# Patient Record
Sex: Female | Born: 2005 | Race: White | Hispanic: No | Marital: Single | Smoking: Never smoker
Health system: Southern US, Community
[De-identification: ages and names within clinical notes are randomized; demographics above are authoritative.]

## PROBLEM LIST (undated history)

## (undated) HISTORY — PX: NO PAST SURGERIES: SHX2092

---
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
## 2023-12-20 ENCOUNTER — Ambulatory Visit (INDEPENDENT_AMBULATORY_CARE_PROVIDER_SITE_OTHER): Payer: Self-pay | Admitting: Family Medicine

## 2023-12-20 ENCOUNTER — Encounter: Payer: Self-pay | Admitting: Family Medicine

## 2023-12-20 VITALS — BP 122/86 | HR 112 | Temp 99.2°F | Ht 63.25 in | Wt 112.0 lb

## 2023-12-20 DIAGNOSIS — H66012 Acute suppurative otitis media with spontaneous rupture of ear drum, left ear: Secondary | ICD-10-CM

## 2023-12-20 DIAGNOSIS — R6889 Other general symptoms and signs: Secondary | ICD-10-CM | POA: Diagnosis not present

## 2023-12-20 LAB — POC COVID19/FLU A&B COMBO
Covid Antigen, POC: NEGATIVE
Influenza A Antigen, POC: NEGATIVE
Influenza B Antigen, POC: NEGATIVE

## 2023-12-20 MED ORDER — FLUTICASONE PROPIONATE 50 MCG/ACT NA SUSP: 2.0000 | Freq: Every day | NASAL | 0 refills | Status: DC

## 2023-12-20 MED ORDER — AMOXICILLIN-POT CLAVULANATE 875-125 MG PO TABS: 1.0000 | ORAL_TABLET | Freq: Two times a day (BID) | ORAL | 0 refills | Status: DC

## 2023-12-20 NOTE — Patient Instructions (Addendum)
Thank you for coming to the office today.  Likely originally was a virus, and has led to a backup of fluid and infection behind the Left ear. R ear looks good.  Lungs are clear.  Start the antibiotic Augmentin twice a day for 10 days  Start nasal steroid Flonase 2 sprays in each nostril daily for 4-6 weeks, may repeat course seasonally or as needed  For pain and swelling / pressure  Alternate Tylenol and Ibuprofen  Tylenol dosing Extr Str 500mg  x 2 = 1000mg  up to 3 times per day max  Ibuprofen dosing is 200mg  x 3 = 600mg  max up to 3 times per day for pain swelling pressure.  May consider Sudafed OTC for sinus pressure as well.  If severe symptoms, not improving, or ear pain pressure, we can consider a Rx Strength steroid Prednisone.  If still not improving hearing issues we can refer to ENT    Otitis Media, Adult  Otitis media occurs when there is inflammation and fluid in the middle ear with signs and symptoms of an acute infection. The middle ear is a part of the ear that contains bones for hearing as well as air that helps send sounds to the brain. When infected fluid builds up in this space, it causes pressure and can lead to an ear infection. The eustachian tube connects the middle ear to the back of the nose (nasopharynx) and normally allows air into the middle ear. If the eustachian tube becomes blocked, fluid can build up and become infected. What are the causes? This condition is caused by a blockage in the eustachian tube. This can be caused by mucus or by swelling of the tube. Problems that can cause a blockage include: A cold or other upper respiratory infection. Allergies. An irritant, such as tobacco smoke. Enlarged adenoids. The adenoids are areas of soft tissue located high in the back of the throat, behind the nose and the roof of the mouth. They are part of the body's defense system (immune system). A mass in the nasopharynx. Damage to the ear caused by pressure  changes (barotrauma). What increases the risk? You are more likely to develop this condition if you: Smoke or are exposed to tobacco smoke. Have an opening in the roof of your mouth (cleft palate). Have gastroesophageal reflux. Have an immune system disorder. What are the signs or symptoms? Symptoms of this condition include: Ear pain. Fever. Decreased hearing. Tiredness (lethargy). Fluid leaking from the ear, if the eardrum is ruptured or has burst. Ringing in the ear. How is this diagnosed?  This condition is diagnosed with a physical exam. During the exam, your health care provider will use an instrument called an otoscope to look in your ear and check for redness, swelling, and fluid. He or she will also ask about your symptoms. Your health care provider may also order tests, such as: A pneumatic otoscopy. This is a test to check the movement of the eardrum. It is done by squeezing a small amount of air into the ear. A tympanogram. This is a test that shows how well the eardrum moves in response to air pressure in the ear canal. It provides a graph for your health care provider to review. How is this treated? This condition can go away on its own within 3-5 days. But if the condition is caused by a bacterial infection and does not go away on its own, or if it keeps coming back, your health care provider may: Prescribe  antibiotic medicine to treat the infection. Prescribe or recommend medicines to control pain. Follow these instructions at home: Take over-the-counter and prescription medicines only as told by your health care provider. If you were prescribed an antibiotic medicine, take it as told by your health care provider. Do not stop taking the antibiotic even if you start to feel better. Keep all follow-up visits. This is important. Contact a health care provider if: You have bleeding from your nose. There is a lump on your neck. You are not feeling better in 5 days. You  feel worse instead of better. Get help right away if: You have severe pain that is not controlled with medicine. You have swelling, redness, or pain around your ear. You have stiffness in your neck. A part of your face is not moving (paralyzed). The bone behind your ear (mastoid bone) is tender when you touch it. You develop a severe headache. Summary Otitis media is redness, soreness, and swelling of the middle ear, usually resulting in pain and decreased hearing. This condition can go away on its own within 3-5 days. If the problem does not go away in 3-5 days, your health care provider may give you medicines to treat the infection. If you were prescribed an antibiotic medicine, take it as told by your health care provider. Follow all instructions that were given to you by your health care provider. This information is not intended to replace advice given to you by your health care provider. Make sure you discuss any questions you have with your health care provider. Document Revised: 02/15/2021 Document Reviewed: 02/15/2021 Elsevier Patient Education  2024 Elsevier Inc.    Please schedule a Follow-up Appointment to: No follow-ups on file.  If you have any other questions or concerns, please feel free to call the office or send a message through MyChart. You may also schedule an earlier appointment if necessary.  Additionally, you may be receiving a survey about your experience at our office within a few days to 1 week by e-mail or mail. We value your feedback.  Saralyn Pilar, DO [facility], [place]

## 2023-12-20 NOTE — Progress Notes (Signed)
Subjective:    Patient ID: Sonya Sherman, female    DOB: 11-07-2006, 18 y.o.   MRN: [id number]  Sonya Sherman is a 18 y.o. female presenting on 12/20/2023 for Ear Pain and Sinusitis  PCP Nicki Reaper, FNP   Patient presents for a same day appointment.   HPI  Discussed the use of AI scribe software for clinical note transcription with the patient, who gave verbal consent to proceed.  History of Present Illness    The patient presents with ear pain and congestion.  She began experiencing symptoms around Saturday or Sunday, initially noticing her voice starting to go out, which led her to suspect she might be getting sick. By Monday, she developed chest congestion, a cough, and a stuffy nose. On Tuesday, her symptoms progressed to include a runny nose, reduced chest congestion, but significant headache and sinus pain.  This morning, she woke up with severe pain in her left ear, for which she took Tylenol. Despite the medication, she continues to experience throbbing and ringing in the ear, along with popping sensations. No hearing loss. No fluid discharge from the ear and she has not used any nasal sprays or other medications for her symptoms.  She mentions a single episode of digestive symptoms on Tuesday, where she felt unable to eat and vomited after consuming chicken noodle soup. However, she was able to eat dinner later that night without issue and has not experienced further digestive symptoms since.  No known history of ear infections but recalls having a sinus infection as a child. No recent exposure to sick individuals and no recent respiratory conditions since infancy, when she had asthma.             07/14/2022   10:25 AM 06/24/2020    2:42 PM  Depression screen PHQ 2/9  Decreased Interest 0 0  Down, Depressed, Hopeless 1 0  PHQ - 2 Score 1 0  Altered sleeping 0 2  Tired, decreased energy 0 1  Change in appetite 1 2  Feeling bad or failure about yourself  1  0  Trouble concentrating 0 1  Moving slowly or fidgety/restless 0 1  Suicidal thoughts 0 0  PHQ-9 Score 3 7  Difficult doing work/chores Somewhat difficult        07/14/2022   10:25 AM 06/24/2020    2:42 PM  GAD 7 : Generalized Anxiety Score  Nervous, Anxious, on Edge 2 1  Control/stop worrying 1 1  Worry too much - different things 1 0  Trouble relaxing 1 0  Restless 2 0  Easily annoyed or irritable 0 1  Afraid - awful might happen 0 2  Total GAD 7 Score 7 5    Social History   Tobacco Use   Smoking status: Never   Smokeless tobacco: Never  Vaping Use   Vaping status: Never Used  Substance Use Topics   Alcohol use: Never   Drug use: Never    Review of Systems Per HPI unless specifically indicated above     Objective:    BP 122/86   Pulse (!) 112   Temp 99.2 F (37.3 C)   Ht 5' 3.25" (1.607 m)   Wt 112 lb (50.8 kg)   SpO2 97%   BMI 19.68 kg/m   Wt Readings from Last 3 Encounters:  12/20/23 112 lb (50.8 kg) (19%, Z= -0.88)*  07/14/22 112 lb (50.8 kg) (24%, Z= -0.71)*  06/24/20 112 lb (50.8 kg) (35%, Z= -0.38)*   *  Growth percentiles are based on CDC (Girls, 2-20 Years) data.    Physical Exam Vitals and nursing note reviewed.  Constitutional:      General: She is not in acute distress.    Appearance: She is well-developed. She is not diaphoretic.     Comments: Well-appearing, comfortable, cooperative  HENT:     Head: Normocephalic and atraumatic.     Right Ear: Tympanic membrane, ear canal and external ear normal. There is no impacted cerumen.     Left Ear: Ear canal and external ear normal. There is no impacted cerumen.     Ears:     Comments: Left TM abnormal anatomy with erythema bulging evidence of TM rupture and some effusion vs purulence. Eyes:     General:        Right eye: No discharge.        Left eye: No discharge.     Conjunctiva/sclera: Conjunctivae normal.  Neck:     Thyroid: No thyromegaly.  Cardiovascular:     Rate and Rhythm:  Normal rate and regular rhythm.     Heart sounds: Normal heart sounds. No murmur heard. Pulmonary:     Effort: Pulmonary effort is normal. No respiratory distress.     Breath sounds: Normal breath sounds. No wheezing or rales.  Musculoskeletal:        General: Normal range of motion.     Cervical back: Normal range of motion and neck supple.  Lymphadenopathy:     Cervical: No cervical adenopathy.  Skin:    General: Skin is warm and dry.     Findings: No erythema or rash.  Neurological:     Mental Status: She is alert and oriented to person, place, and time.  Psychiatric:        Behavior: Behavior normal.     Comments: Well groomed, good eye contact, normal speech and thoughts     Results for orders placed or performed in visit on 12/20/23  POC Covid19/Flu A&B Antigen   Collection Time: 12/20/23  1:51 PM  Result Value Ref Range   Influenza A Antigen, POC Negative Negative   Influenza B Antigen, POC Negative Negative   Covid Antigen, POC Negative Negative      Assessment & Plan:   Problem List Items Addressed This Visit   None Visit Diagnoses       Non-recurrent acute suppurative otitis media of left ear with spontaneous rupture of tympanic membrane    -  Primary   Relevant Medications   amoxicillin-clavulanate (AUGMENTIN) 875-125 MG tablet   fluticasone (FLONASE) 50 MCG/ACT nasal spray     Flu-like symptoms       Relevant Orders   POC Covid19/Flu A&B Antigen (Completed)         Otitis Media, left Acute, with TM rupture spontaneous Left ear pain, throbbing, and ringing with a history of recent upper respiratory symptoms. Eardrum appears red and swollen on examination with concern of abnormal anatomy with mildrupture. No history of ear infections. No discharge noted.  -Prescribe Augmentin orally twice a day for 10 days. -Advise against ear drops or submerging ear in water. Start nasal steroid Flonase 2 sprays in each nostril daily for 4-6 weeks, may repeat course  seasonally or as needed   Upper Respiratory Infection Recent onset of chest congestion, cough, and nasal symptoms. No respiratory distress noted on examination. COVID and flu tests negative. Proceed w/ antibiotics as above Flonase -Consider over-the-counter Sudafed for sinus pressure.  Pain Management Headache and ear  pain reported. Currently taking Tylenol. -Advise alternating Tylenol and ibuprofen for pain and swelling relief. Dosing advised   Follow-up If severe symptoms persist or hearing issues develop, consider referral to ENT. Check-in after completion of antibiotic course.        Orders Placed This Encounter  Procedures   POC Covid19/Flu A&B Antigen    Meds ordered this encounter  Medications   amoxicillin-clavulanate (AUGMENTIN) 875-125 MG tablet    Sig: Take 1 tablet by mouth 2 (two) times daily.    Dispense:  20 tablet    Refill:  0   fluticasone (FLONASE) 50 MCG/ACT nasal spray    Sig: Place 2 sprays into both nostrils daily. Use for 4-6 weeks then stop and use seasonally or as needed.    Dispense:  16 g    Refill:  0    Follow up plan: Return if symptoms worsen or fail to improve.   Saralyn Pilar, DO [facility] 12/20/2023, 1:48 PM

## 2024-01-16 ENCOUNTER — Other Ambulatory Visit: Payer: Self-pay | Admitting: Family Medicine

## 2024-01-16 DIAGNOSIS — H66012 Acute suppurative otitis media with spontaneous rupture of ear drum, left ear: Secondary | ICD-10-CM

## 2024-01-17 NOTE — Telephone Encounter (Signed)
 Courtesy refill. Requested Prescriptions  Pending Prescriptions Disp Refills   fluticasone (FLONASE) 50 MCG/ACT nasal spray [Pharmacy Med Name: FLUTICASONE PROP 50 MCG SPRAY] 16 mL 0    Sig: PLACE 2 SPRAYS INTO BOTH NOSTRILS DAILY. USE FOR 4-6 WEEKS THEN STOP AND USE SEASONALLY OR AS NEEDED.     Ear, Nose, and Throat: Nasal Preparations - Corticosteroids Failed - 01/17/2024  7:44 AM      Failed - Valid encounter within last 12 months    Recent Outpatient Visits           4 weeks ago Non-recurrent acute suppurative otitis media of left ear with spontaneous rupture of tympanic membrane   [facility] Smitty Cords, DO   1 year ago Need for influenza vaccination   [facility] [place], Salvadore Oxford, NP   3 years ago Encounter to establish care   [facility], Kandee Keen, DO

## 2024-06-19 ENCOUNTER — Ambulatory Visit (INDEPENDENT_AMBULATORY_CARE_PROVIDER_SITE_OTHER): Payer: Self-pay | Admitting: Internal Medicine

## 2024-06-19 ENCOUNTER — Encounter: Payer: Self-pay | Admitting: Internal Medicine

## 2024-06-19 VITALS — BP 112/68 | Ht 63.5 in | Wt 118.4 lb

## 2024-06-19 DIAGNOSIS — Z23 Encounter for immunization: Secondary | ICD-10-CM

## 2024-06-19 DIAGNOSIS — Z0000 Encounter for general adult medical examination without abnormal findings: Secondary | ICD-10-CM

## 2024-06-19 NOTE — Patient Instructions (Signed)

## 2024-06-19 NOTE — Progress Notes (Signed)
 Subjective:   HPI  Pt presents to the clinic today for her annual exam today.  Flu: 06/2022 Tetanus: 07/2014 Covid: Pfizer x 2 Dentist: biannually  Diet: She does eat meat. She consumes fruits and veggies. She does eat some fried foods. She drinks mostly water, soda. Exercise: Walking  No past medical history on file.  Current Outpatient Medications  Medication Sig Dispense Refill   amoxicillin -clavulanate (AUGMENTIN ) 875-125 MG tablet Take 1 tablet by mouth 2 (two) times daily. 20 tablet 0   fluticasone  (FLONASE ) 50 MCG/ACT nasal spray PLACE 2 SPRAYS INTO BOTH NOSTRILS DAILY. USE FOR 4-6 WEEKS THEN STOP AND USE SEASONALLY OR AS NEEDED. 16 mL 0   No current facility-administered medications for this visit.    No Known Allergies  Family History  Problem Relation Age of Onset   Healthy Mother    Diabetes Father    Hypertension Father    Healthy Sister    Healthy Brother    Breast cancer Paternal Grandmother    Dementia Paternal Grandmother    Skin cancer Paternal Grandfather     Social History   Socioeconomic History   Marital status: Single    Spouse name: Not on file   Number of children: Not on file   Years of education: Not on file   Highest education level: Not on file  Occupational History   Not on file  Tobacco Use   Smoking status: Never   Smokeless tobacco: Never  Vaping Use   Vaping status: Never Used  Substance and Sexual Activity   Alcohol use: Never   Drug use: Never   Sexual activity: Not on file  Other Topics Concern   Not on file  Social History Narrative   Not on file   Social Drivers of Health   Financial Resource Strain: Not on file  Food Insecurity: Not on file  Transportation Needs: Not on file  Physical Activity: Not on file  Stress: Not on file  Social Connections: Not on file  Intimate Partner Violence: Not on file    ROS:  Constitutional: Denies fever, malaise, fatigue, headache or abrupt weight changes.  HEENT:  Denies eye pain, eye redness, ear pain, ringing in the ears, wax buildup, runny nose, nasal congestion, bloody nose, or sore throat. Respiratory: Denies difficulty breathing, shortness of breath, cough or sputum production.   Cardiovascular: Denies chest pain, chest tightness, palpitations or swelling in the hands or feet.  Gastrointestinal: Pt reports intermittent reflux. Denies bloating, constipation, diarrhea or blood in the stool.  GU: Denies frequency, urgency, pain with urination, blood in urine, odor or discharge. Musculoskeletal: Denies decrease in range of motion, difficulty with gait, muscle pain or joint pain and swelling.  Skin: Denies redness, rashes, lesions or ulcercations.  Neurological: Denies dizziness, difficulty with memory, difficulty with speech or problems with balance and coordination.  Psych: Denies anxiety, depression, SI/HI.  No other specific complaints in a complete review of systems (except as listed in HPI above).  PE:  BP 112/68 (BP Location: Left Arm, Patient Position: Sitting, Cuff Size: Normal)   Ht 5' 3.5 (1.613 m)   Wt 118 lb 6.4 oz (53.7 kg)   LMP 06/18/2024 (Exact Date)   BMI 20.64 kg/m   Wt Readings from Last 3 Encounters:  12/20/23 112 lb (50.8 kg) (19%, Z= -0.88)*  07/14/22 112 lb (50.8 kg) (24%, Z= -0.71)*  06/24/20 112 lb (50.8 kg) (35%, Z= -0.38)*   * Growth percentiles are based on CDC (Girls,  2-20 Years) data.    General: Appears her stated age, well developed, well nourished in NAD. Skin: Warm, dry and intact.  No redness, rashes, lesions or ulcerations noted. HEENT: Head: normal shape and size; Eyes: sclera white, no icterus, conjunctiva pink, PERRLA and EOMs intact;  Neck: Neck supple, trachea midline. No masses, lumps or thyromegaly present.  Cardiovascular: Tachycardic with normal rhythm. S1,S2 noted.  No murmur, rubs or gallops noted. No JVD or BLE edema.  Pulmonary/Chest: Normal effort and positive vesicular breath sounds. No  respiratory distress. No wheezes, rales or ronchi noted.  Abdomen: Soft and nontender. Normal bowel sounds. Musculoskeletal: Strength 5/5 BUE/BLE. No difficulty with gait.  Neurological: Alert and oriented. Cranial nerves II-XII grossly intact. Coordination normal.  Psychiatric: Mood and affect normal. Behavior is normal. Judgment and thought content normal.     Assessment and Plan:  Preventative Health Maintenance:  Encouraged her to get a flu shot in fall Tetanus today Encouraged her to get her COVID booster Advised her to see an eye doctor and dentist annually She declines labs today  RTC 1 year, sooner if needed  Angeline Laura, NP

## 2025-06-20 ENCOUNTER — Encounter: Payer: Self-pay | Admitting: Internal Medicine
